# Patient Record
Sex: Male | Born: 2000 | Race: Black or African American | Hispanic: No | Marital: Single
Health system: Southern US, Community
[De-identification: ages and names within clinical notes are randomized; demographics above are authoritative.]

---
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
## 2021-09-06 IMAGING — CT CT CERVICAL SPINE W/O CM
4 series · 16 of 33 positions shown, 19 images · non-contrast
Comparison: None.

CLINICAL DATA: Multi trauma.

EXAM:
CT CERVICAL SPINE WITHOUT CONTRAST
TECHNIQUE: Multidetector CT imaging of the cervical spine was performed without
intravenous contrast. Multiplanar CT image reconstructions were also
generated.

[Series 5: orthogonal axials · axial · 0.32mm/px · z∈[+1187,+1289]mm · 5 of 86 slices shown, 7 images]
[im 15/86  soft-tissue]
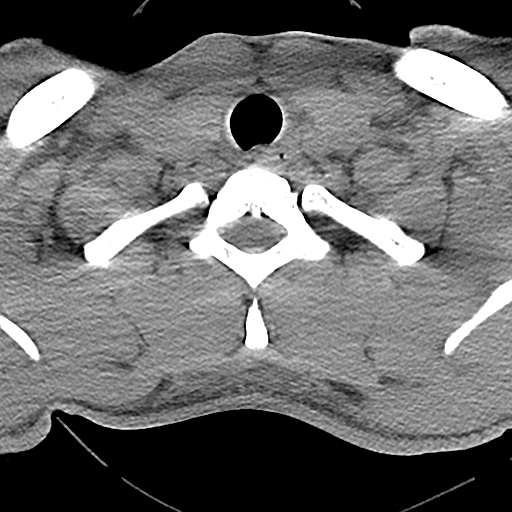
[im 15/86  bone]
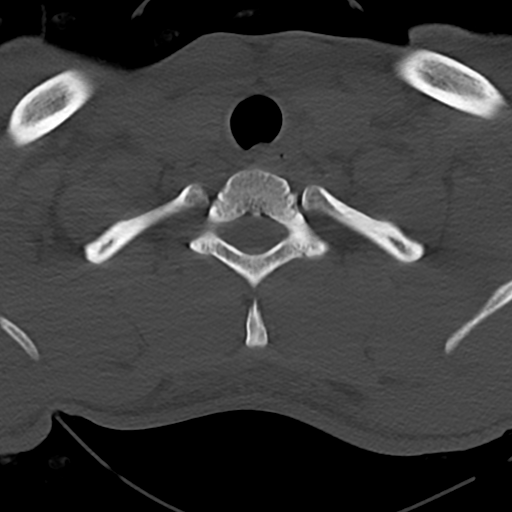
[im 29/86  bone]
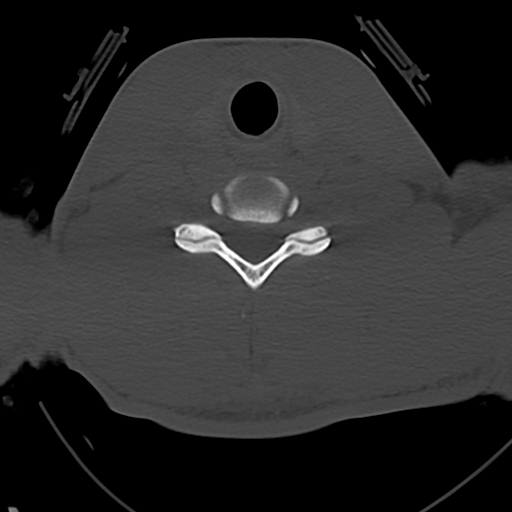
[im 43/86  bone]
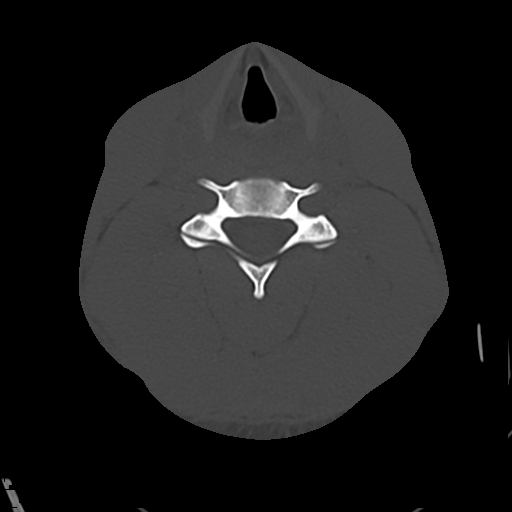
[im 57/86  bone]
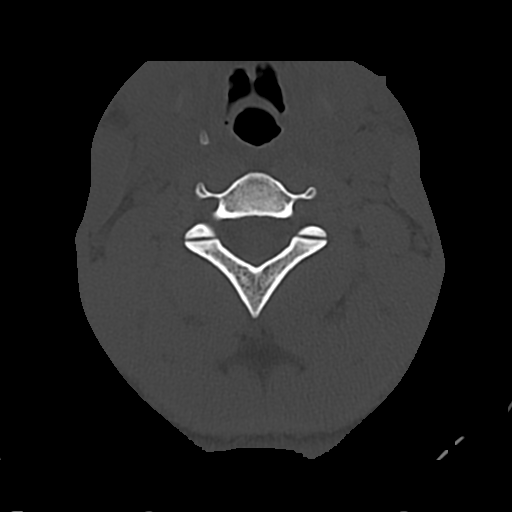
[im 71/86  soft-tissue]
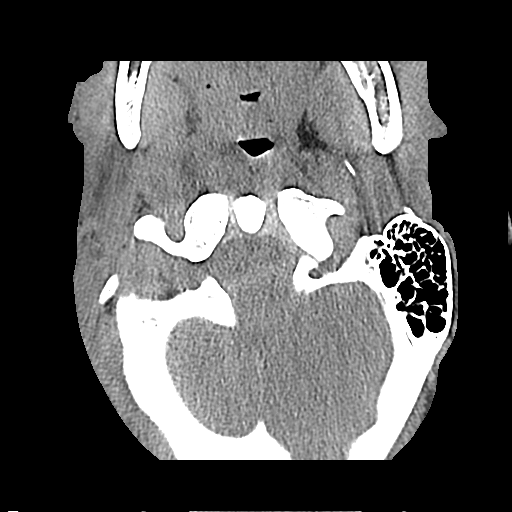
[im 71/86  bone]
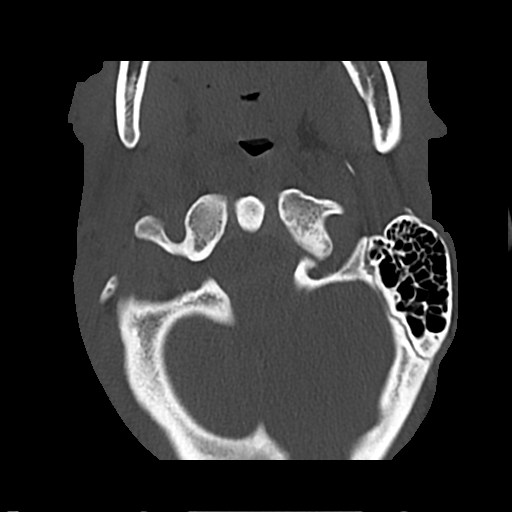

[Series 6: sag bone · sagittal · 0.33mm/px · 5 of 84 slices shown, 6 images]
[im 28/84  bone]
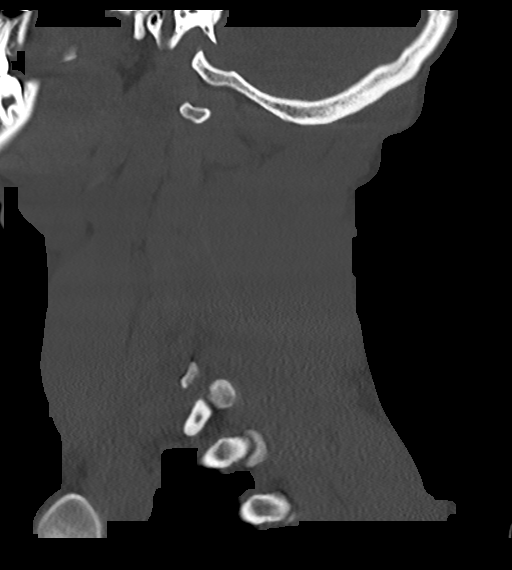
[im 35/84  bone]
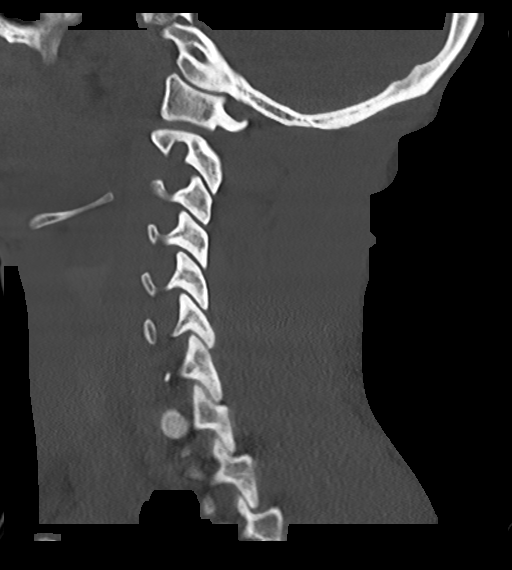
[im 42/84  soft-tissue]
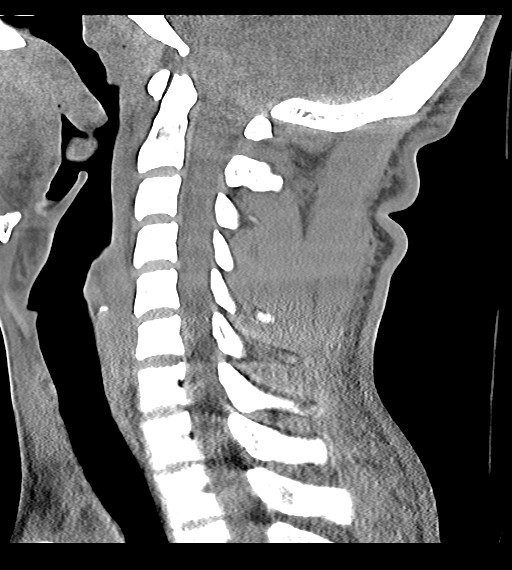
[im 42/84  bone]
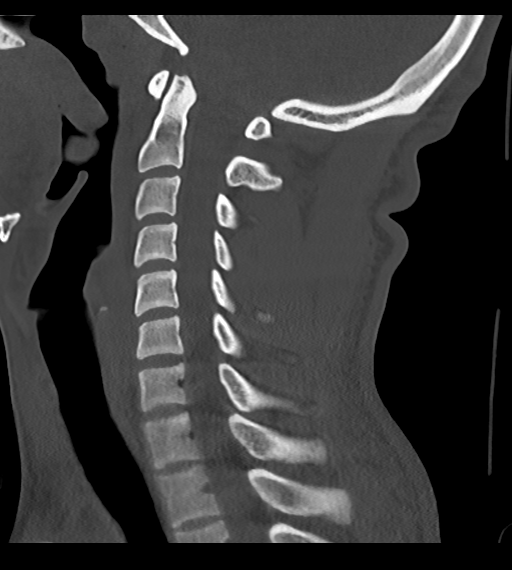
[im 49/84  bone]
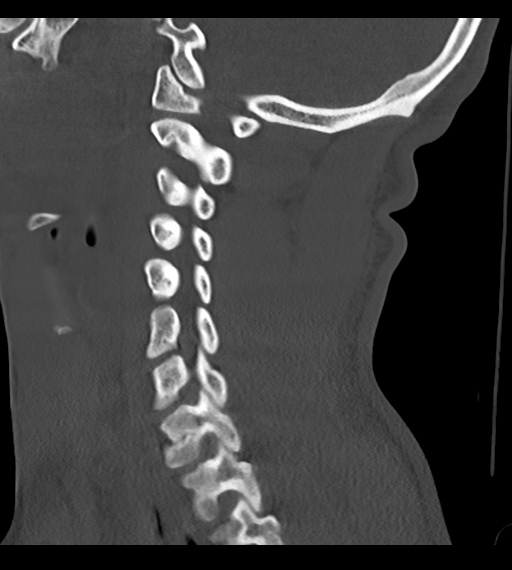
[im 56/84  bone]
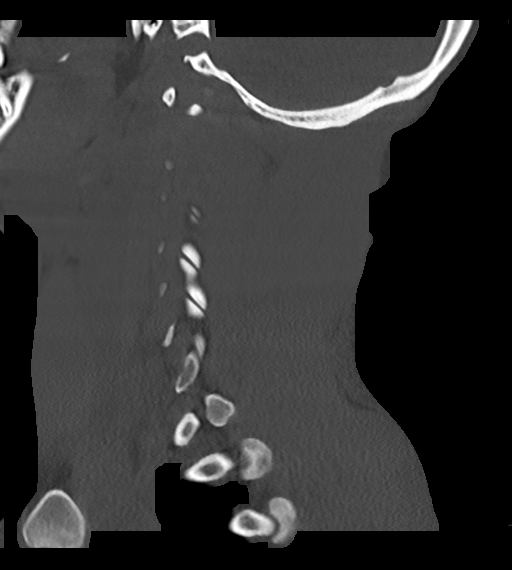

[Series 7: cor bone · coronal · 0.33mm/px · 3 of 85 slices shown]
[im 17/85  bone]
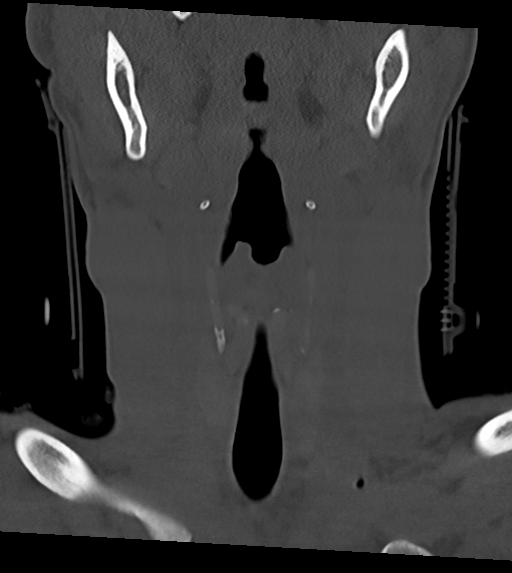
[im 34/85  bone]
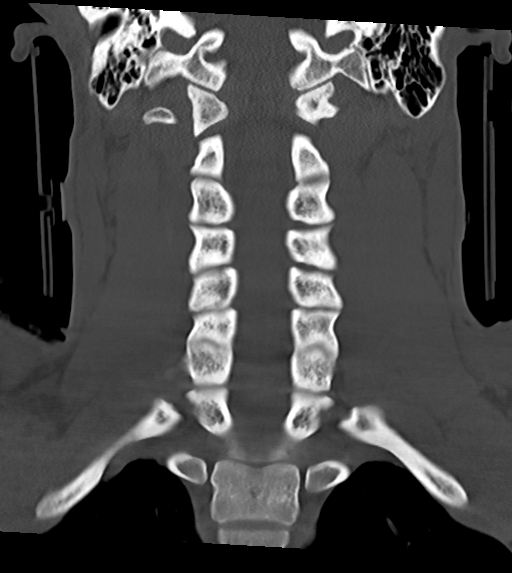
[im 51/85  bone]
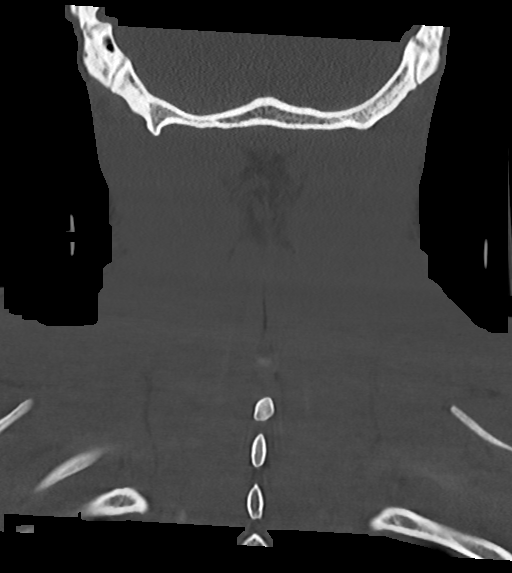

[Series 8: c spine soft · axial · 0.31mm/px · z∈[+1201,+1257]mm · 3 of 86 slices shown]
[im 15/86  soft-tissue]
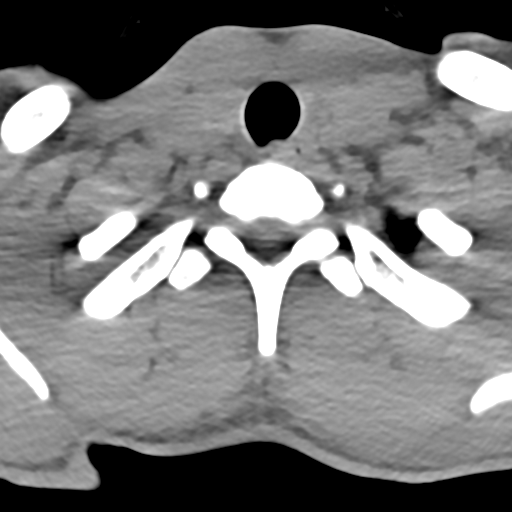
[im 29/86  soft-tissue]
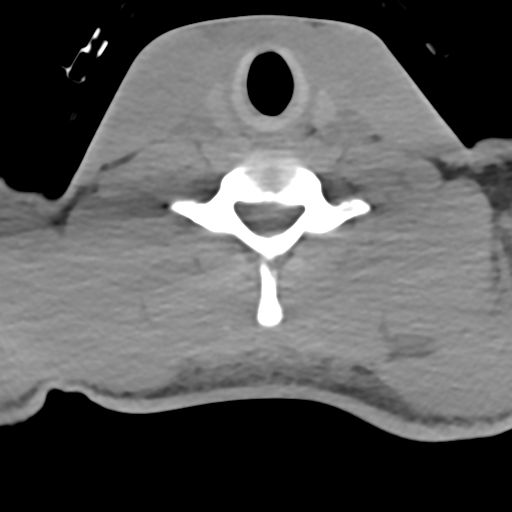
[im 43/86  soft-tissue]
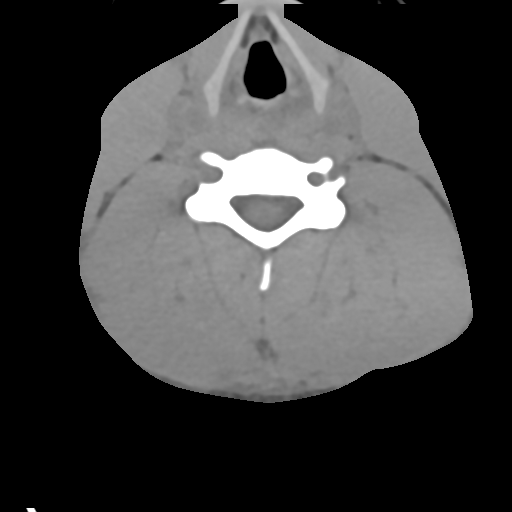

[16 of 33 positions shown; findings below may reference images not displayed]

FINDINGS: Alignment: Normal

Skull base and vertebrae: Normal

Soft tissues and spinal canal: Normal

Disc levels:  Normal

Upper chest: Normal

Other: None
IMPRESSION: Normal cervical spine CT. No traumatic finding.

## 2021-09-06 IMAGING — CT CT T SPINE W/O CM
3 of 4 series · 9 of 33 positions shown, 10 images · non-contrast
Comparison: None.

CLINICAL DATA: Multi trauma.

EXAM:
CT THORACIC SPINE WITHOUT CONTRAST
TECHNIQUE: Multidetector CT images of the thoracic were obtained using the
standard protocol without intravenous contrast.

[Series 8: orthogonal bone · axial · 0.27mm/px · z∈[+1067,+1067]mm · 1 of 180 slices shown, 2 images]
[im 90/180  soft-tissue]
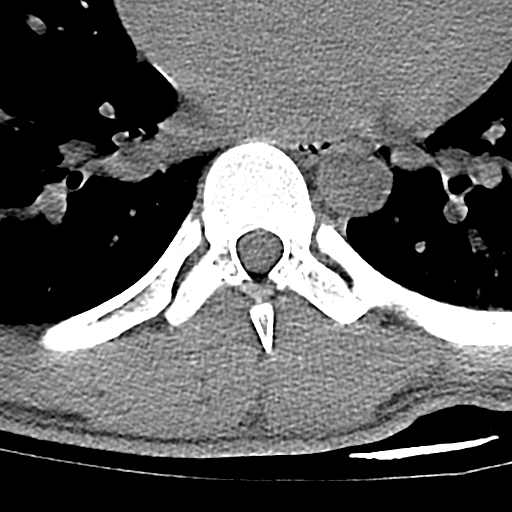
[im 90/180  bone]
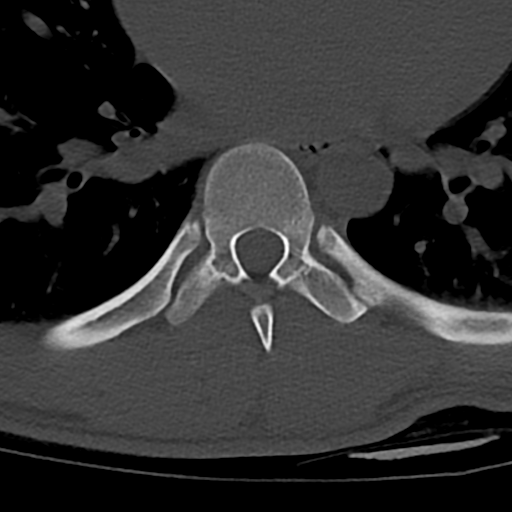

[Series 9: sag bone · sagittal · 0.31mm/px · 5 of 79 slices shown]
[im 27/79  bone]
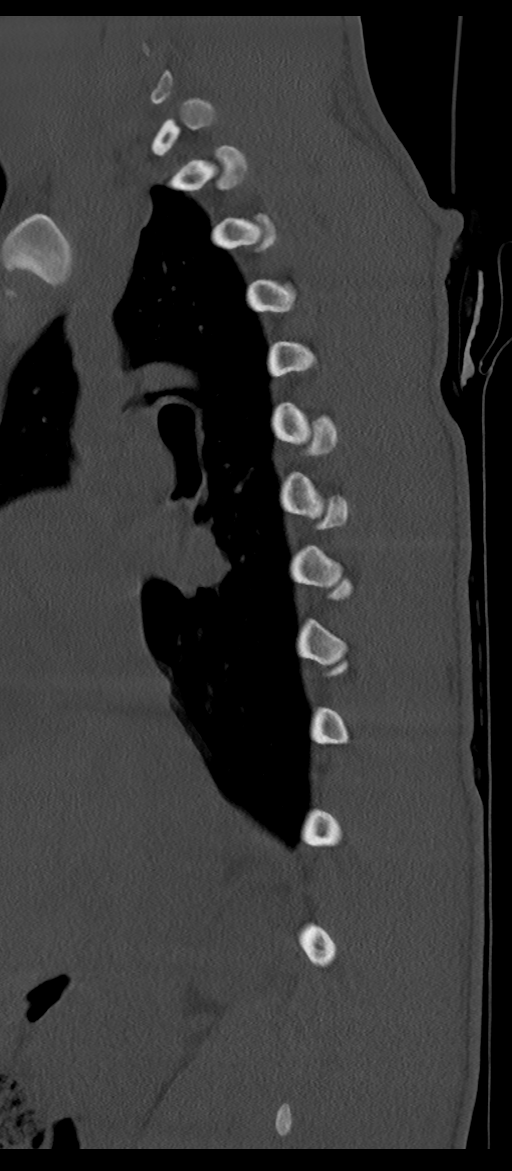
[im 33/79  bone]
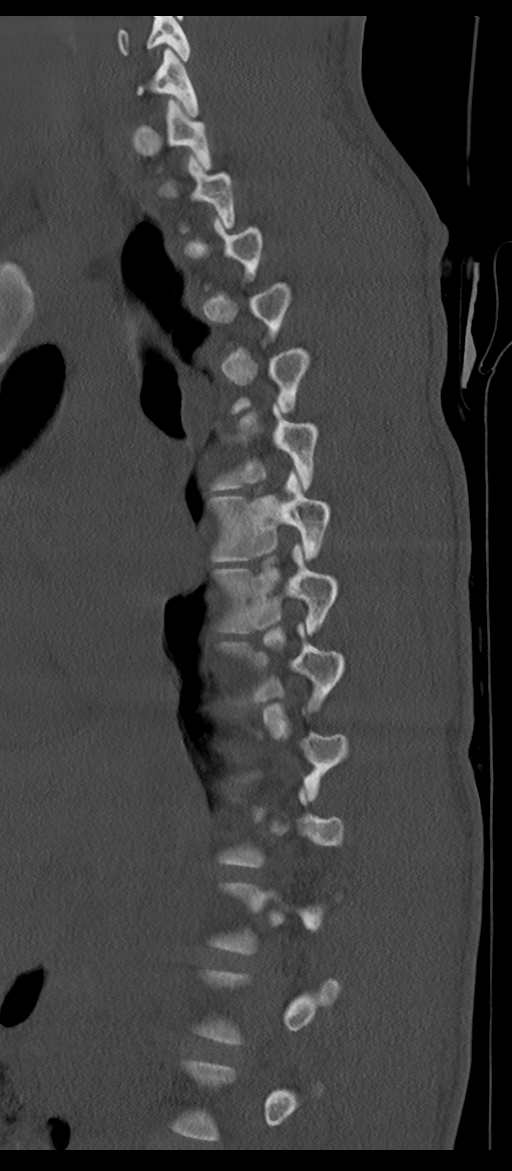
[im 40/79  bone]
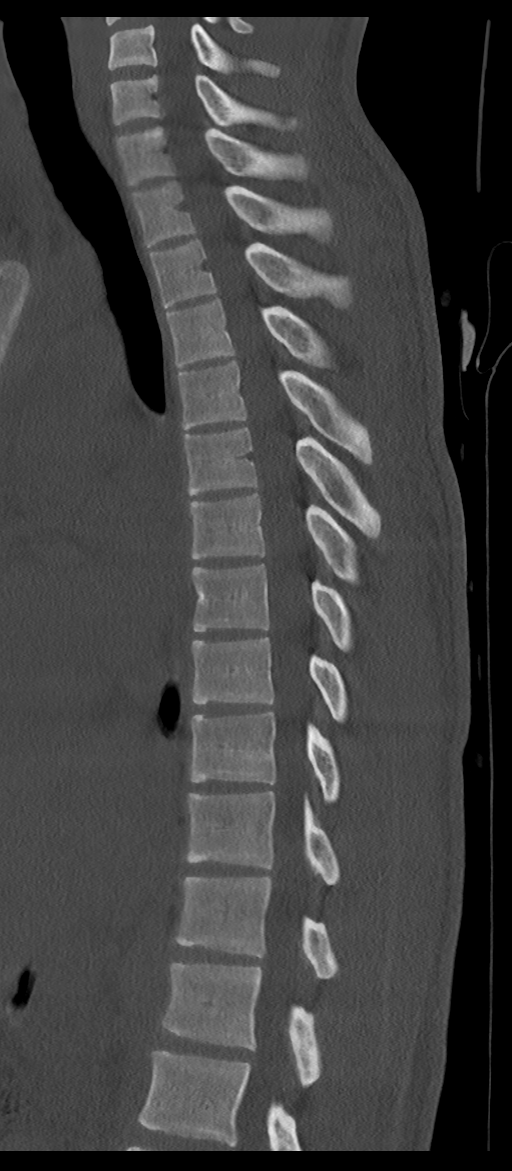
[im 46/79  bone]
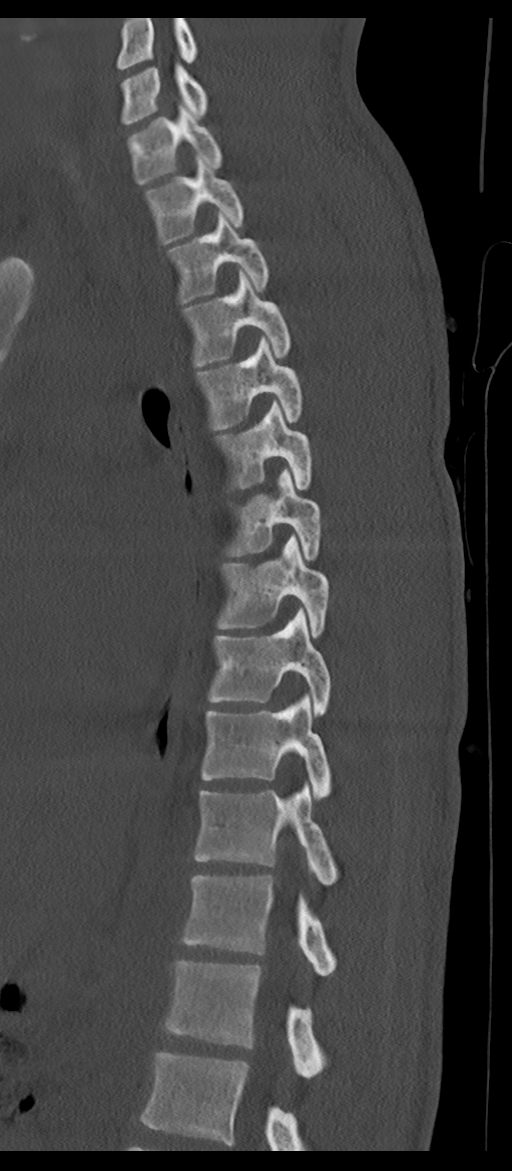
[im 53/79  bone]
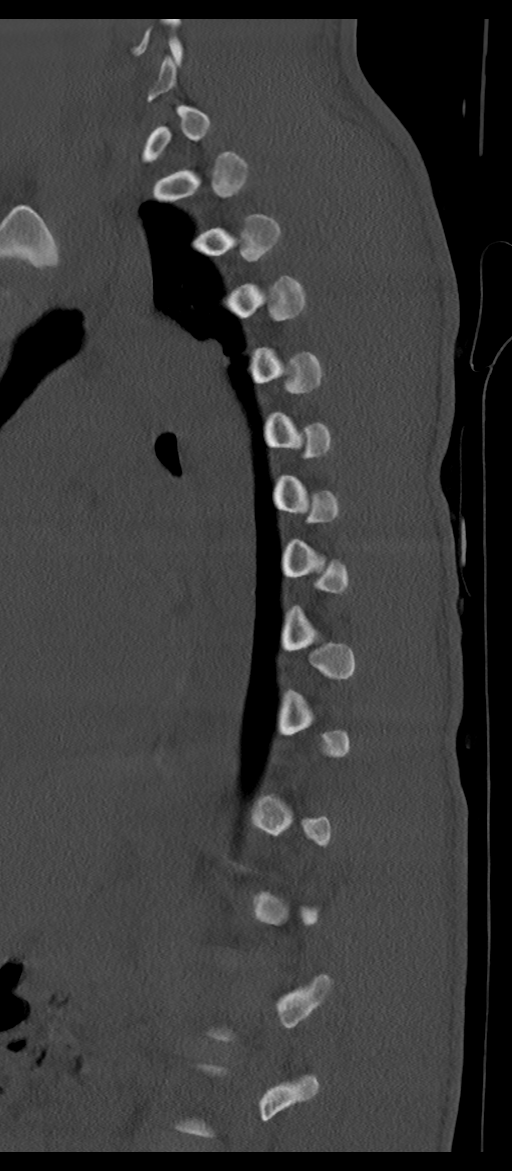

[Series 10: cor bone · coronal · 0.31mm/px · 3 of 81 slices shown]
[im 17/81  bone]
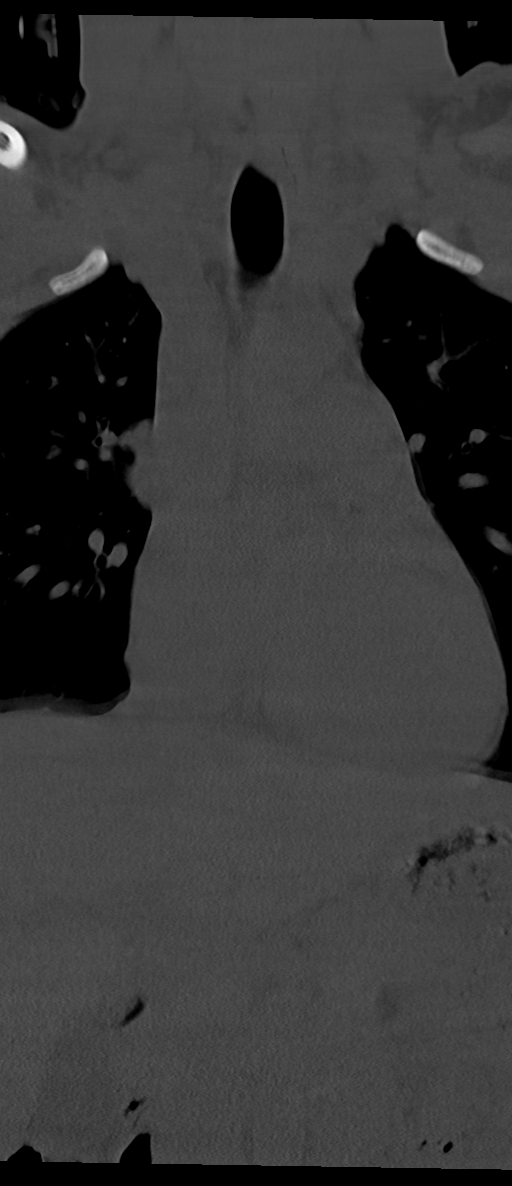
[im 33/81  bone]
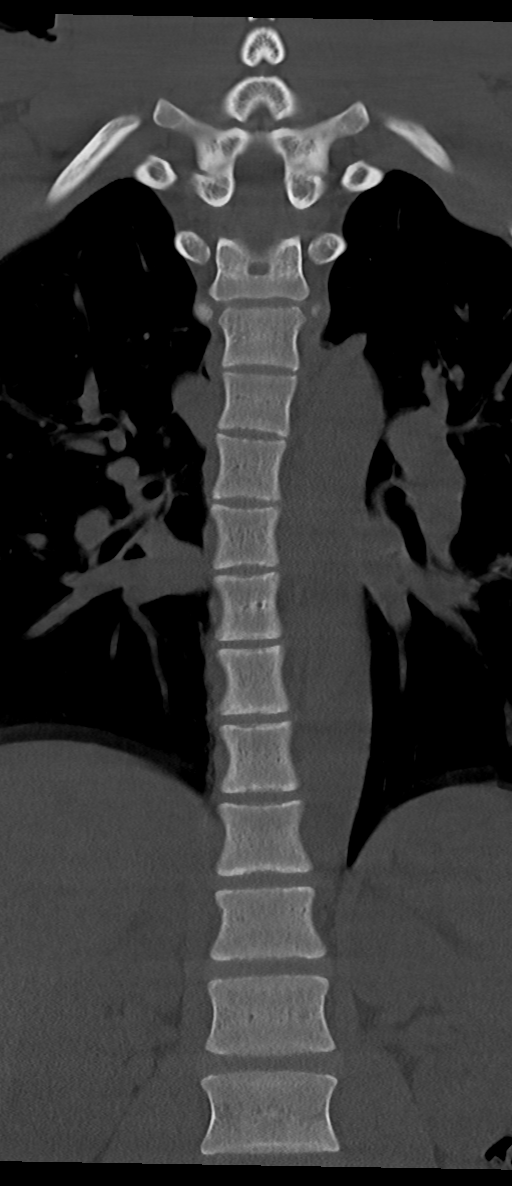
[im 49/81  bone]
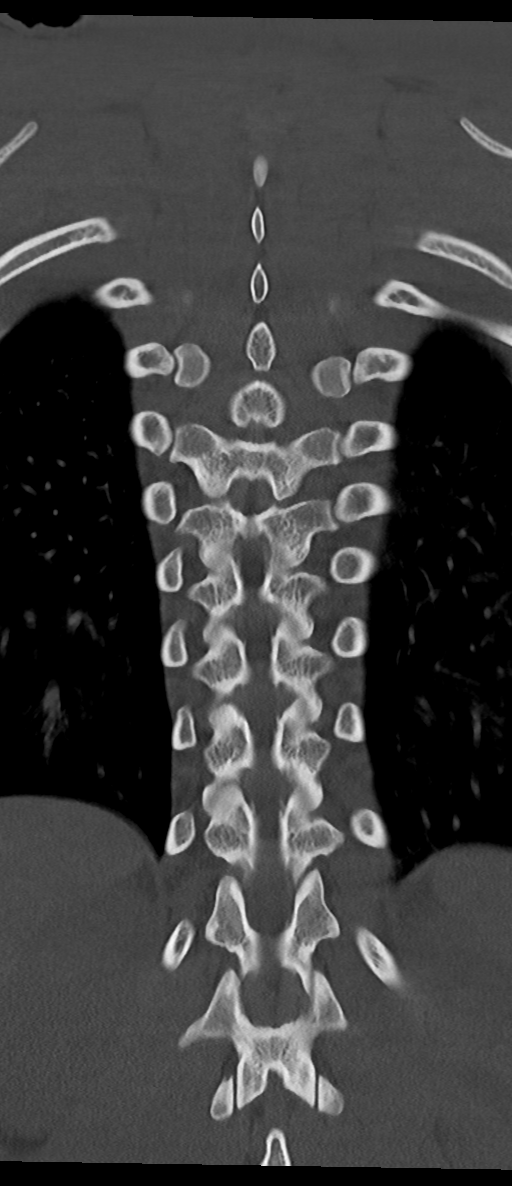

[9 of 33 positions shown; findings below may reference images not displayed]

FINDINGS: Alignment: Normal

Vertebrae: Normal

Paraspinal and other soft tissues: Normal

Disc levels: No evidence of thoracic disc disease. No degenerative
joint findings. Posteromedial ribs are negative.
IMPRESSION: Normal CT scan of the thoracic spine. No traumatic finding.
